# Patient Record
Sex: Male | Born: 1935 | Race: Black or African American | Hispanic: No | Marital: Married
Health system: Southern US, Community
[De-identification: ages and names within clinical notes are randomized; demographics above are authoritative.]

---

## 2017-02-05 ENCOUNTER — Other Ambulatory Visit (HOSPITAL_COMMUNITY): Payer: Self-pay

## 2017-02-05 ENCOUNTER — Inpatient Hospital Stay
Admission: AD | Admit: 2017-02-05 | Discharge: 2017-03-12 | Disposition: E | Payer: Self-pay | Source: Ambulatory Visit | Attending: Internal Medicine | Admitting: Internal Medicine

## 2017-02-05 DIAGNOSIS — Z931 Gastrostomy status: Secondary | ICD-10-CM

## 2017-02-05 DIAGNOSIS — R1310 Dysphagia, unspecified: Secondary | ICD-10-CM

## 2017-02-05 DIAGNOSIS — D499 Neoplasm of unspecified behavior of unspecified site: Secondary | ICD-10-CM

## 2017-02-05 DIAGNOSIS — Z4659 Encounter for fitting and adjustment of other gastrointestinal appliance and device: Secondary | ICD-10-CM

## 2017-02-05 LAB — HEMOGLOBIN A1C
Hgb A1c MFr Bld: 5.1 % (ref 4.8–5.6)
MEAN PLASMA GLUCOSE: 99.67 mg/dL

## 2017-02-06 ENCOUNTER — Other Ambulatory Visit (HOSPITAL_COMMUNITY): Payer: Self-pay

## 2017-02-06 LAB — PROTIME-INR
INR: 1.58
Prothrombin Time: 18.7 seconds — ABNORMAL HIGH (ref 11.4–15.2)

## 2017-02-06 LAB — CBC
HEMATOCRIT: 27.4 % — AB (ref 39.0–52.0)
HEMOGLOBIN: 9.2 g/dL — AB (ref 13.0–17.0)
MCH: 30 pg (ref 26.0–34.0)
MCHC: 33.6 g/dL (ref 30.0–36.0)
MCV: 89.3 fL (ref 78.0–100.0)
Platelets: 220 10*3/uL (ref 150–400)
RBC: 3.07 MIL/uL — AB (ref 4.22–5.81)
RDW: 20.9 % — AB (ref 11.5–15.5)
WBC: 10 10*3/uL (ref 4.0–10.5)

## 2017-02-06 LAB — MAGNESIUM: Magnesium: 2.3 mg/dL (ref 1.7–2.4)

## 2017-02-06 LAB — PHOSPHORUS: Phosphorus: 4 mg/dL (ref 2.5–4.6)

## 2017-02-06 LAB — TSH: TSH: 8.245 u[IU]/mL — ABNORMAL HIGH (ref 0.350–4.500)

## 2017-02-06 NOTE — Consult Note (Signed)
[facility] CONSULT NOTE    Date: 02/06/2017                  Patient Name:  Bobby Taylor  MRN: [id number]  DOB: 1926-11-02  Age / Sex: 83 y.o., male         PCP: System, Provider Not In                 Service Requesting Consult: Hospitalist                 Reason for Consult:  Evaluation and management of ESRD.            History of Present Illness: Patient is a 83 y.o. male with a PMHx of ESRD on HD MWF, hypertension, diabetes mellitus type 2, coronary artery disease, hyperlipidemia, peripheral arterial disease, history of CVA, anemia of chronic kidney disease, and secondary hyperparathyroidism, who was admitted to [facility] on 02/01/2017 for ongoing treatment of recent non-ST elevation myocardial infarction, MRSA sepsis, and management of ESRD.  Patient was at the outside hospital from January 23, 2017 to February 05, 2017.  When he initially presented he came in with fever and abdominal pain.  He was also hypotensive.  His initial troponin was greater than 10.  He was also found to have a left upper lobe pneumonia.  He was started on antibiotics for this.  He also had an echocardiogram which showed depressed ejection fraction.  This was felt to be secondary to his non-ST elevation myocardial infarction.  He also subsequently was found to have MRSA sepsis.  He was apparently treated with daptomycin for this.  In addition the patient developed some difficulties with swallowing.  PEG tube was being considered however in light of the recent sepsis this was deferred for now.  Medications: Medications upon discharge from outside facility: Acetaminophen 650 mg p.o. every 6 hours as needed, albuterol/ipratropium 3 mL's inhaled every 6 hours as needed, Lipitor 20 mg nightly, isosorbide mononitrate 30 mg every morning, loratadine 10 mg daily, metoprolol 50 mg twice daily, Protonix 40 mg daily, Renvela 1600 mg p.o. 3 times daily with meals, Colace 100 mg p.o. twice daily,  Epogen 10,000 units with dialysis, Neurontin 100 mg 3 times daily, thiamine 100 mg IM daily.  Allergies: pregabalin   Past Medical History:   ESRD on HD MWF, hypertension, diabetes mellitus type 2, coronary artery disease, hyperlipidemia, peripheral arterial disease, history of CVA, anemia of chronic kidney disease, and secondary hyperparathyroidism  Past Surgical History: Left upper extremity AV fistula Peripheral arterial disease with history of stent placement  Family History: Unable to obtain from the patient as he has encephalopathy at the moment.  Social History: Unable to obtain from the patient as he has encephalopathy at the moment.  Review of Systems: Unable to obtain from the patient as he has encephalopathy at the moment.  Vital Signs: Temperature 98 pulse 82 respirations 16 blood pressure 148/84  Physical Exam: General: NAD, resting comfortably  Head: Normocephalic, atraumatic.  Eyes: Anicteric  Nose: NG in place  Throat: Oropharynx nonerythematous, no exudate appreciated.   Neck: Supple, trachea midline.  Lungs:  Scattered rhonchi, normal effort  Heart: S1S2 no rubs  Abdomen:  BS normoactive. Soft, Nondistended, non-tender.  No masses or organomegaly.  Extremities: No pretibial edema.  Neurologic: Awake but not following commands  Skin: No visible rashes, scars.    Lab results: Basic Metabolic Panel: Recent Labs  Lab 02/06/17 0801  MG 2.3  PHOS  4.0    Liver Function Tests: No results for input(s): AST, ALT, ALKPHOS, BILITOT, PROT, ALBUMIN in the last 168 hours. No results for input(s): LIPASE, AMYLASE in the last 168 hours. No results for input(s): AMMONIA in the last 168 hours.  CBC: Recent Labs  Lab 02/06/17 0801  WBC 10.0  HGB 9.2*  HCT 27.4*  MCV 89.3  PLT 220    Cardiac Enzymes: No results for input(s): CKTOTAL, CKMB, CKMBINDEX, TROPONINI in the last 168 hours.  BNP: Invalid input(s): POCBNP  CBG: No results for input(s):  GLUCAP in the last 168 hours.  Microbiology: No results found for this or any previous visit.  Coagulation Studies: Recent Labs    02/06/17 0801  LABPROT 18.7*  INR 1.58    Urinalysis: No results for input(s): COLORURINE, LABSPEC, PHURINE, GLUCOSEU, HGBUR, BILIRUBINUR, KETONESUR, PROTEINUR, UROBILINOGEN, NITRITE, LEUKOCYTESUR in the last 72 hours.  Invalid input(s): APPERANCEUR    Imaging: Dg Abd 1 View  Result Date: 02/06/2017 CLINICAL DATA:  Nasogastric tube placement. EXAM: ABDOMEN - 1 VIEW COMPARISON:  Radiograph of February 05, 2017. FINDINGS: Nasogastric tube is been partially withdrawn since prior exam, with distal tip in expected position of gastroesophageal junction. Status post cholecystectomy. No abnormal bowel gas pattern is noted. IMPRESSION: Distal tip of nasogastric tube is been partially withdrawn, now located in expected position of gastroesophageal junction. Electronically Signed   By: Marijo Conception, M.D.   On: 02/06/2017 12:59   Dg Chest Port 1 View  Result Date: 02/06/2017 CLINICAL DATA:  Shortness of breath EXAM: PORTABLE CHEST 1 VIEW COMPARISON:  Portable exam 0612 hours without priors for comparison FINDINGS: RIGHT subclavian transvenous pacemaker with lead tip projecting over RIGHT ventricle. Enlargement of cardiac silhouette with pulmonary vascular congestion. Atherosclerotic calcification aorta. Scattered interstitial infiltrates question pulmonary edema though infection and chronic interstitial disease could have a similar appearance. More focal opacity likely scarring in LEFT upper lobe. No gross pleural effusion or pneumothorax. Bones demineralized. IMPRESSION: Enlargement of cardiac silhouette with pulmonary vascular congestion post pacemaker. Scattered interstitial infiltrates bilaterally question pulmonary edema as above. Electronically Signed   By: Lavonia Dana M.D.   On: 02/06/2017 09:00   Dg Abd Portable 1v  Result Date: 01/22/2017 CLINICAL DATA:   83 y/o  M; feeding tube placement. EXAM: PORTABLE ABDOMEN - 1 VIEW COMPARISON:  None. FINDINGS: Enteric tube tip projects over proximal stomach. Right upper quadrant surgical clips, presumably cholecystectomy. Single lead pacemaker noted. Normal bowel gas pattern. Multilevel degenerative changes of the spine. IMPRESSION: Enteric tube tip projects over proximal stomach. Electronically Signed   By: Kristine Garbe M.D.   On: 01/13/2017 20:24      Assessment & Plan: Pt is a 83 y.o. male with a PMHx of ESRD on HD MWF, hypertension, diabetes mellitus type 2, coronary artery disease, hyperlipidemia, peripheral arterial disease, history of CVA, anemia of chronic kidney disease, and secondary hyperparathyroidism, who was admitted to [facility] on 02/08/2017 for ongoing treatment of recent non-ST elevation myocardial infarction, MRSA sepsis, and management of ESRD.  1.  ESRD on HD.  Patient last had dialysis on Monday.  We will plan for dialysis again tomorrow given nursing schedule.  Orders to be prepared.  We plan to use his left upper extremity AV fistula.  2.  Anemia of chronic kidney disease.  Hemoglobin currently 9.2.  We will start the patient on Aranesp 60 mcg subcutaneous weekly.  3.  Secondary hyperparathyroidism.  We plan to maintain the patient on Renvela 2 tablets  p.o. 3 times daily with meals.  Follow-up PTH as well as phosphorus.  4.  MRSA sepsis.  Patient to be continued on daptomycin.

## 2017-02-07 LAB — CBC
HEMATOCRIT: 22.1 % — AB (ref 39.0–52.0)
HEMOGLOBIN: 7.6 g/dL — AB (ref 13.0–17.0)
MCH: 30.5 pg (ref 26.0–34.0)
MCHC: 34.4 g/dL (ref 30.0–36.0)
MCV: 88.8 fL (ref 78.0–100.0)
Platelets: 249 10*3/uL (ref 150–400)
RBC: 2.49 MIL/uL — ABNORMAL LOW (ref 4.22–5.81)
RDW: 21.1 % — AB (ref 11.5–15.5)
WBC: 8 10*3/uL (ref 4.0–10.5)

## 2017-02-07 LAB — RENAL FUNCTION PANEL
ALBUMIN: 1.4 g/dL — AB (ref 3.5–5.0)
Anion gap: 14 (ref 5–15)
BUN: 52 mg/dL — AB (ref 6–20)
CHLORIDE: 92 mmol/L — AB (ref 101–111)
CO2: 29 mmol/L (ref 22–32)
CREATININE: 6.02 mg/dL — AB (ref 0.61–1.24)
Calcium: 8.3 mg/dL — ABNORMAL LOW (ref 8.9–10.3)
GFR, EST AFRICAN AMERICAN: 9 mL/min — AB (ref >60)
GFR, EST NON AFRICAN AMERICAN: 7 mL/min — AB (ref >60)
Glucose, Bld: 125 mg/dL — ABNORMAL HIGH (ref 65–99)
PHOSPHORUS: 5.2 mg/dL — AB (ref 2.5–4.6)
POTASSIUM: 3.6 mmol/L (ref 3.5–5.1)
Sodium: 135 mmol/L (ref 135–145)

## 2017-02-08 LAB — PTH, INTACT AND CALCIUM
Calcium, Total (PTH): 8.5 mg/dL — ABNORMAL LOW (ref 8.6–10.2)
PTH: 86 pg/mL — ABNORMAL HIGH (ref 15–65)

## 2017-02-08 LAB — HEPATITIS B SURFACE ANTIGEN: Hepatitis B Surface Ag: NEGATIVE

## 2017-02-08 LAB — HEPATITIS B SURFACE ANTIBODY,QUALITATIVE: HEP B S AB: REACTIVE

## 2017-02-08 LAB — HEPATITIS PANEL, ACUTE
HCV Ab: 0.2 s/co ratio (ref 0.0–0.9)
HEP A IGM: NEGATIVE
HEP B C IGM: NEGATIVE
HEP B S AG: NEGATIVE

## 2017-02-08 LAB — HEPATITIS B CORE ANTIBODY, IGM: HEP B C IGM: NEGATIVE

## 2017-02-08 NOTE — Progress Notes (Signed)
[facility]  ROUNDING NOTE   Subjective:  Patient resting comfortably in bed. He will be due for dialysis again tomorrow. NG tube still in place.   Objective:  Vital signs in last 24 hours:  Temperature 97.6 pulse 80 respirations 24 blood pressure 168/80  Physical Exam: General: No acute distress  Head: Normocephalic, atraumatic. NG in place  Eyes: Anicteric  Neck: Supple, trachea midline  Lungs:  Scattered rhonchi, normal effort  Heart: S1S2 no rubs  Abdomen:  Soft, nontender, bowel sounds present  Extremities: Trace peripheral edema.  Neurologic: Awake, alert, not following commands  Skin: No lesions  Access: LUE AVF    Basic Metabolic Panel: Recent Labs  Lab 02/06/17 0801 02/07/17 0519  NA  --  135  K  --  3.6  CL  --  92*  CO2  --  29  GLUCOSE  --  125*  BUN  --  52*  CREATININE  --  6.02*  CALCIUM  --  8.3*  MG 2.3  --   PHOS 4.0 5.2*    Liver Function Tests: Recent Labs  Lab 02/07/17 0519  ALBUMIN 1.4*   No results for input(s): LIPASE, AMYLASE in the last 168 hours. No results for input(s): AMMONIA in the last 168 hours.  CBC: Recent Labs  Lab 02/06/17 0801 02/07/17 0519  WBC 10.0 8.0  HGB 9.2* 7.6*  HCT 27.4* 22.1*  MCV 89.3 88.8  PLT 220 249    Cardiac Enzymes: No results for input(s): CKTOTAL, CKMB, CKMBINDEX, TROPONINI in the last 168 hours.  BNP: Invalid input(s): POCBNP  CBG: No results for input(s): GLUCAP in the last 168 hours.  Microbiology: No results found for this or any previous visit.  Coagulation Studies: Recent Labs    02/06/17 0801  LABPROT 18.7*  INR 1.58    Urinalysis: No results for input(s): COLORURINE, LABSPEC, PHURINE, GLUCOSEU, HGBUR, BILIRUBINUR, KETONESUR, PROTEINUR, UROBILINOGEN, NITRITE, LEUKOCYTESUR in the last 72 hours.  Invalid input(s): APPERANCEUR    Imaging: Dg Abd 1 View  Result Date: 02/06/2017 CLINICAL DATA:  Nasogastric tube placement. EXAM: ABDOMEN - 1 VIEW  COMPARISON:  Radiograph of February 05, 2017. FINDINGS: Nasogastric tube is been partially withdrawn since prior exam, with distal tip in expected position of gastroesophageal junction. Status post cholecystectomy. No abnormal bowel gas pattern is noted. IMPRESSION: Distal tip of nasogastric tube is been partially withdrawn, now located in expected position of gastroesophageal junction. Electronically Signed   By: Marijo Conception, M.D.   On: 02/06/2017 12:59     Medications:       Assessment/ Plan:  83 y.o. male with a PMHx of ESRD on HD MWF, hypertension, diabetes mellitus type 2, coronary artery disease, hyperlipidemia, peripheral arterial disease, history of CVA, anemia of chronic kidney disease, and secondary hyperparathyroidism, who was admitted to [facility] on 02/01/2017 for ongoing treatment of recent non-ST elevation myocardial infarction, MRSA sepsis, and management of ESRD.  1.  ESRD on HD.    Plan to maintain the patient on a Tuesday, Thursday, Saturday dialysis schedule.  Therefore he will be scheduled for dialysis again tomorrow.  We will prepare orders.  2.  Anemia of chronic kidney disease.    Hemoglobin down significantly to 7.6.  Consider blood transfusion for hemoglobin of 7 or less.  Continue Aranesp otherwise.  3.  Secondary hyperparathyroidism.    Phosphorus currently 5.2.  Continue to monitor.  4.  MRSA sepsis.  Patient to be treated with daptomycin as per hospitalist.  LOS: 0 Aniayah Alaniz 11/30/20188:31 AM

## 2017-02-09 LAB — CBC
HEMATOCRIT: 22.2 % — AB (ref 39.0–52.0)
Hemoglobin: 7.3 g/dL — ABNORMAL LOW (ref 13.0–17.0)
MCH: 29.8 pg (ref 26.0–34.0)
MCHC: 32.9 g/dL (ref 30.0–36.0)
MCV: 90.6 fL (ref 78.0–100.0)
Platelets: 273 10*3/uL (ref 150–400)
RBC: 2.45 MIL/uL — ABNORMAL LOW (ref 4.22–5.81)
RDW: 20.8 % — AB (ref 11.5–15.5)
WBC: 7.9 10*3/uL (ref 4.0–10.5)

## 2017-02-09 LAB — RENAL FUNCTION PANEL
ALBUMIN: 1.4 g/dL — AB (ref 3.5–5.0)
Anion gap: 12 (ref 5–15)
BUN: 46 mg/dL — AB (ref 6–20)
CHLORIDE: 93 mmol/L — AB (ref 101–111)
CO2: 27 mmol/L (ref 22–32)
Calcium: 8.2 mg/dL — ABNORMAL LOW (ref 8.9–10.3)
Creatinine, Ser: 5.94 mg/dL — ABNORMAL HIGH (ref 0.61–1.24)
GFR calc Af Amer: 9 mL/min — ABNORMAL LOW (ref >60)
GFR calc non Af Amer: 8 mL/min — ABNORMAL LOW (ref >60)
GLUCOSE: 216 mg/dL — AB (ref 65–99)
Phosphorus: 3.5 mg/dL (ref 2.5–4.6)
Potassium: 3.2 mmol/L — ABNORMAL LOW (ref 3.5–5.1)
Sodium: 132 mmol/L — ABNORMAL LOW (ref 135–145)

## 2017-02-09 DEATH — deceased

## 2017-02-11 NOTE — Progress Notes (Signed)
  [facility]  ROUNDING NOTE   Subjective:  Patient seen at bedside. Has a temperature 101.5 at the moment.    Objective:  Vital signs in last 24 hours:  Temperature 101.5 pulse 88 respirations 20 blood pressure 126/58  Physical Exam: General: No acute distress  Head: Normocephalic, atraumatic. NG in place  Eyes: Anicteric  Neck: Supple, trachea midline  Lungs:  Scattered rhonchi, normal effort  Heart: S1S2 no rubs  Abdomen:  Soft, nontender, bowel sounds present  Extremities: Trace peripheral edema.  Neurologic: Awake, alert, not following commands  Skin: No lesions  Access: LUE AVF    Basic Metabolic Panel: Recent Labs  Lab 02/06/17 0801 02/07/17 0519 02/09/17 0521  NA  --  135 132*  K  --  3.6 3.2*  CL  --  92* 93*  CO2  --  29 27  GLUCOSE  --  125* 216*  BUN  --  52* 46*  CREATININE  --  6.02* 5.94*  CALCIUM  --  8.3*  8.5* 8.2*  MG 2.3  --   --   PHOS 4.0 5.2* 3.5    Liver Function Tests: Recent Labs  Lab 02/07/17 0519 02/09/17 0521  ALBUMIN 1.4* 1.4*   No results for input(s): LIPASE, AMYLASE in the last 168 hours. No results for input(s): AMMONIA in the last 168 hours.  CBC: Recent Labs  Lab 02/06/17 0801 02/07/17 0519 02/09/17 0521  WBC 10.0 8.0 7.9  HGB 9.2* 7.6* 7.3*  HCT 27.4* 22.1* 22.2*  MCV 89.3 88.8 90.6  PLT 220 249 273    Cardiac Enzymes: No results for input(s): CKTOTAL, CKMB, CKMBINDEX, TROPONINI in the last 168 hours.  BNP: Invalid input(s): POCBNP  CBG: No results for input(s): GLUCAP in the last 168 hours.  Microbiology: No results found for this or any previous visit.  Coagulation Studies: No results for input(s): LABPROT, INR in the last 72 hours.  Urinalysis: No results for input(s): COLORURINE, LABSPEC, PHURINE, GLUCOSEU, HGBUR, BILIRUBINUR, KETONESUR, PROTEINUR, UROBILINOGEN, NITRITE, LEUKOCYTESUR in the last 72 hours.  Invalid input(s): APPERANCEUR    Imaging: No results  found.   Medications:       Assessment/ Plan:  83 y.o. male with a PMHx of ESRD on HD MWF, hypertension, diabetes mellitus type 2, coronary artery disease, hyperlipidemia, peripheral arterial disease, history of CVA, anemia of chronic kidney disease, and secondary hyperparathyroidism, who was admitted to [facility] on 01/24/2017 for ongoing treatment of recent non-ST elevation myocardial infarction, MRSA sepsis, and management of ESRD.  1.  ESRD on HD.    Patient to have hemodialysis again tomorrow.  Orders will be prepared.  2.  Anemia of chronic kidney disease.    Hemoglobin down to 7.3.  Otherwise we will maintain the patient on Aranesp.  Consider blood transfusion for hemoglobin of 7 or less.  3.  Secondary hyperparathyroidism.    Phosphorus at target at 3.5 at last check.  Continue to monitor.  4.  MRSA sepsis.  Patient to be treated with daptomycin as per hospitalist.     LOS: 0 Shiven Junious 12/3/20184:29 PM

## 2017-02-12 LAB — RENAL FUNCTION PANEL
ALBUMIN: 1.4 g/dL — AB (ref 3.5–5.0)
ANION GAP: 13 (ref 5–15)
BUN: 55 mg/dL — ABNORMAL HIGH (ref 6–20)
CALCIUM: 8.4 mg/dL — AB (ref 8.9–10.3)
CO2: 29 mmol/L (ref 22–32)
Chloride: 88 mmol/L — ABNORMAL LOW (ref 101–111)
Creatinine, Ser: 5.68 mg/dL — ABNORMAL HIGH (ref 0.61–1.24)
GFR, EST AFRICAN AMERICAN: 9 mL/min — AB (ref >60)
GFR, EST NON AFRICAN AMERICAN: 8 mL/min — AB (ref >60)
Glucose, Bld: 187 mg/dL — ABNORMAL HIGH (ref 65–99)
PHOSPHORUS: 4 mg/dL (ref 2.5–4.6)
Potassium: 4.6 mmol/L (ref 3.5–5.1)
SODIUM: 130 mmol/L — AB (ref 135–145)

## 2017-02-12 LAB — CBC
HCT: 23.9 % — ABNORMAL LOW (ref 39.0–52.0)
HEMOGLOBIN: 7.7 g/dL — AB (ref 13.0–17.0)
MCH: 29.7 pg (ref 26.0–34.0)
MCHC: 32.2 g/dL (ref 30.0–36.0)
MCV: 92.3 fL (ref 78.0–100.0)
Platelets: 280 10*3/uL (ref 150–400)
RBC: 2.59 MIL/uL — ABNORMAL LOW (ref 4.22–5.81)
RDW: 21.3 % — ABNORMAL HIGH (ref 11.5–15.5)
WBC: 6.3 10*3/uL (ref 4.0–10.5)

## 2017-02-12 LAB — CK: CK TOTAL: 37 U/L — AB (ref 49–397)

## 2017-02-13 NOTE — Progress Notes (Signed)
  [facility]  ROUNDING NOTE   Subjective:  Patient resting in bed. Intermittently moaning. Not following commands. Completed hemodialysis yesterday.  Objective:  Vital signs in last 24 hours:  Temperature 97.8 pulse 73 respirations 20 blood pressure 169/92  Physical Exam: General: No acute distress  Head: Normocephalic, atraumatic. NG in place  Eyes: Anicteric  Neck: Supple, trachea midline  Lungs:  Scattered rhonchi, normal effort  Heart: S1S2 no rubs  Abdomen:  Soft, nontender, bowel sounds present  Extremities: Trace peripheral edema.  Neurologic: Awake, alert, not following commands  Skin: No lesions  Access: LUE AVF    Basic Metabolic Panel: Recent Labs  Lab 02/07/17 0519 02/09/17 0521 02/12/17 0634  NA 135 132* 130*  K 3.6 3.2* 4.6  CL 92* 93* 88*  CO2 29 27 29   GLUCOSE 125* 216* 187*  BUN 52* 46* 55*  CREATININE 6.02* 5.94* 5.68*  CALCIUM 8.3*  8.5* 8.2* 8.4*  PHOS 5.2* 3.5 4.0    Liver Function Tests: Recent Labs  Lab 02/07/17 0519 02/09/17 0521 02/12/17 0634  ALBUMIN 1.4* 1.4* 1.4*   No results for input(s): LIPASE, AMYLASE in the last 168 hours. No results for input(s): AMMONIA in the last 168 hours.  CBC: Recent Labs  Lab 02/07/17 0519 02/09/17 0521 02/12/17 0634  WBC 8.0 7.9 6.3  HGB 7.6* 7.3* 7.7*  HCT 22.1* 22.2* 23.9*  MCV 88.8 90.6 92.3  PLT 249 273 280    Cardiac Enzymes: Recent Labs  Lab 02/12/17 0634  CKTOTAL 37*    BNP: Invalid input(s): POCBNP  CBG: No results for input(s): GLUCAP in the last 168 hours.  Microbiology: No results found for this or any previous visit.  Coagulation Studies: No results for input(s): LABPROT, INR in the last 72 hours.  Urinalysis: No results for input(s): COLORURINE, LABSPEC, PHURINE, GLUCOSEU, HGBUR, BILIRUBINUR, KETONESUR, PROTEINUR, UROBILINOGEN, NITRITE, LEUKOCYTESUR in the last 72 hours.  Invalid input(s): APPERANCEUR    Imaging: No results  found.   Medications:       Assessment/ Plan:  83 y.o. male with a PMHx of ESRD on HD MWF, hypertension, diabetes mellitus type 2, coronary artery disease, hyperlipidemia, peripheral arterial disease, history of CVA, anemia of chronic kidney disease, and secondary hyperparathyroidism, who was admitted to [facility] on 01/17/2017 for ongoing treatment of recent non-ST elevation myocardial infarction, MRSA sepsis, and management of ESRD.  1.  ESRD on HD.    Patient completed hemodialysis yesterday.  We will plan for dialysis again tomorrow.  Orders to be prepared.  2.  Anemia of chronic kidney disease.    Hemoglobin up slightly to 7.7.  Maintain the patient on Aranesp.  Consider blood transition for hemoglobin of 7 or less.\  3.  Secondary hyperparathyroidism.    Phosphorus remains under good control at 4.0.  Follow-up serum phosphorus tomorrow.  4.  MRSA sepsis.  Patient to be treated with daptomycin as per hospitalist.  5.  Severe protein calorie malnutrition.  Albumin currently 1.4.  Nutrition team on board.    LOS: 0 Bobby Taylor 12/5/20183:06 PM

## 2017-02-14 LAB — RENAL FUNCTION PANEL
ALBUMIN: 1.4 g/dL — AB (ref 3.5–5.0)
ANION GAP: 13 (ref 5–15)
BUN: 70 mg/dL — ABNORMAL HIGH (ref 6–20)
CALCIUM: 8.3 mg/dL — AB (ref 8.9–10.3)
CO2: 27 mmol/L (ref 22–32)
CREATININE: 5.27 mg/dL — AB (ref 0.61–1.24)
Chloride: 89 mmol/L — ABNORMAL LOW (ref 101–111)
GFR calc non Af Amer: 9 mL/min — ABNORMAL LOW (ref >60)
GFR, EST AFRICAN AMERICAN: 10 mL/min — AB (ref >60)
Glucose, Bld: 117 mg/dL — ABNORMAL HIGH (ref 65–99)
PHOSPHORUS: 3.9 mg/dL (ref 2.5–4.6)
Potassium: 4.3 mmol/L (ref 3.5–5.1)
SODIUM: 129 mmol/L — AB (ref 135–145)

## 2017-02-14 LAB — CBC
HCT: 21.4 % — ABNORMAL LOW (ref 39.0–52.0)
HEMOGLOBIN: 7.2 g/dL — AB (ref 13.0–17.0)
MCH: 31 pg (ref 26.0–34.0)
MCHC: 33.6 g/dL (ref 30.0–36.0)
MCV: 92.2 fL (ref 78.0–100.0)
Platelets: 280 10*3/uL (ref 150–400)
RBC: 2.32 MIL/uL — AB (ref 4.22–5.81)
RDW: 21 % — ABNORMAL HIGH (ref 11.5–15.5)
WBC: 8.2 10*3/uL (ref 4.0–10.5)

## 2017-02-15 LAB — HEPARIN LEVEL (UNFRACTIONATED): Heparin Unfractionated: >2.2 IU/mL — ABNORMAL HIGH (ref 0.30–0.70)

## 2017-02-15 NOTE — Progress Notes (Signed)
  [facility]  ROUNDING NOTE   Subjective:  No significant change in status. Patient resting comfortably in bed. Still has NG tube in place.  Objective:  Vital signs in last 24 hours:  Temperature 99.2 pulse 67 respirations 20 blood pressure 115/54  Physical Exam: General: No acute distress  Head: Normocephalic, atraumatic. NG in place  Eyes: Anicteric  Neck: Supple, trachea midline  Lungs:  Scattered rhonchi, normal effort  Heart: S1S2 no rubs  Abdomen:  Soft, nontender, bowel sounds present  Extremities: Trace peripheral edema.  Neurologic: Awake, alert, not following commands  Skin: No lesions  Access: LUE AVF    Basic Metabolic Panel: Recent Labs  Lab 02/09/17 0521 02/12/17 0634 02/14/17 0652  NA 132* 130* 129*  K 3.2* 4.6 4.3  CL 93* 88* 89*  CO2 27 29 27   GLUCOSE 216* 187* 117*  BUN 46* 55* 70*  CREATININE 5.94* 5.68* 5.27*  CALCIUM 8.2* 8.4* 8.3*  PHOS 3.5 4.0 3.9    Liver Function Tests: Recent Labs  Lab 02/09/17 0521 02/12/17 0634 02/14/17 0652  ALBUMIN 1.4* 1.4* 1.4*   No results for input(s): LIPASE, AMYLASE in the last 168 hours. No results for input(s): AMMONIA in the last 168 hours.  CBC: Recent Labs  Lab 02/09/17 0521 02/12/17 0634 02/14/17 0652  WBC 7.9 6.3 8.2  HGB 7.3* 7.7* 7.2*  HCT 22.2* 23.9* 21.4*  MCV 90.6 92.3 92.2  PLT 273 280 280    Cardiac Enzymes: Recent Labs  Lab 02/12/17 0634  CKTOTAL 37*    BNP: Invalid input(s): POCBNP  CBG: No results for input(s): GLUCAP in the last 168 hours.  Microbiology: No results found for this or any previous visit.  Coagulation Studies: No results for input(s): LABPROT, INR in the last 72 hours.  Urinalysis: No results for input(s): COLORURINE, LABSPEC, PHURINE, GLUCOSEU, HGBUR, BILIRUBINUR, KETONESUR, PROTEINUR, UROBILINOGEN, NITRITE, LEUKOCYTESUR in the last 72 hours.  Invalid input(s): APPERANCEUR    Imaging: No results found.   Medications:        Assessment/ Plan:  83 y.o. male with a PMHx of ESRD on HD MWF, hypertension, diabetes mellitus type 2, coronary artery disease, hyperlipidemia, peripheral arterial disease, history of CVA, anemia of chronic kidney disease, and secondary hyperparathyroidism, who was admitted to [facility] on 01/30/2017 for ongoing treatment of recent non-ST elevation myocardial infarction, MRSA sepsis, and management of ESRD.  1.  ESRD on HD.    Patient completed hemodialysis yesterday.  We will plan for dialysis again tomorrow.  Ultrafiltration target 1.5 kg.  2.  Anemia of chronic kidney disease.    Hemoglobin continues to drift down and is currently 7.2.  If hemoglobin becomes 7 or less would recommend transfusion.  3.  Secondary hyperparathyroidism.    Phosphorus 3.9 and at target.  Continue to monitor.  4.  MRSA sepsis.  Management as per hospitalist.  5.  Severe protein calorie malnutrition.  Protein calorie malnutrition persists.  Albumin remains low at 1.4.   LOS: 0 Bobby Taylor 12/7/20188:49 AM

## 2017-02-16 LAB — CBC
HCT: 21.6 % — ABNORMAL LOW (ref 39.0–52.0)
Hemoglobin: 7 g/dL — ABNORMAL LOW (ref 13.0–17.0)
MCH: 30.4 pg (ref 26.0–34.0)
MCHC: 32.4 g/dL (ref 30.0–36.0)
MCV: 93.9 fL (ref 78.0–100.0)
PLATELETS: 272 10*3/uL (ref 150–400)
RBC: 2.3 MIL/uL — ABNORMAL LOW (ref 4.22–5.81)
RDW: 21.1 % — AB (ref 11.5–15.5)
WBC: 8.2 10*3/uL (ref 4.0–10.5)

## 2017-02-16 LAB — RENAL FUNCTION PANEL
ALBUMIN: 1.3 g/dL — AB (ref 3.5–5.0)
Anion gap: 10 (ref 5–15)
BUN: 55 mg/dL — AB (ref 6–20)
CALCIUM: 8.2 mg/dL — AB (ref 8.9–10.3)
CO2: 29 mmol/L (ref 22–32)
Chloride: 93 mmol/L — ABNORMAL LOW (ref 101–111)
Creatinine, Ser: 4.07 mg/dL — ABNORMAL HIGH (ref 0.61–1.24)
GFR calc Af Amer: 14 mL/min — ABNORMAL LOW (ref >60)
GFR calc non Af Amer: 12 mL/min — ABNORMAL LOW (ref >60)
GLUCOSE: 160 mg/dL — AB (ref 65–99)
PHOSPHORUS: 3.7 mg/dL (ref 2.5–4.6)
Potassium: 4.2 mmol/L (ref 3.5–5.1)
SODIUM: 132 mmol/L — AB (ref 135–145)

## 2017-02-18 ENCOUNTER — Other Ambulatory Visit (HOSPITAL_COMMUNITY): Payer: Self-pay

## 2017-02-18 ENCOUNTER — Encounter: Payer: Self-pay | Admitting: General Surgery

## 2017-02-18 NOTE — Progress Notes (Signed)
[facility]  ROUNDING NOTE   Subjective:  Patient seen at bedside. It appears that he will be receiving PEG tube placement in the relative near future. Due for dialysis tomorrow.  Objective:  Vital signs in last 24 hours:  Temperature 96.5 pulse 68 respirations 16 blood pressure 154/83  Physical Exam: General: No acute distress  Head: Normocephalic, atraumatic. NG in place  Eyes: Anicteric  Neck: Supple, trachea midline  Lungs:  Scattered rhonchi, normal effort  Heart: S1S2 no rubs  Abdomen:  Soft, nontender, bowel sounds present  Extremities: Trace peripheral edema.  Neurologic: Awake, alert, not following commands  Skin: No lesions  Access: LUE AVF    Basic Metabolic Panel: Recent Labs  Lab 02/12/17 0634 02/14/17 0652 02/16/17 0437  NA 130* 129* 132*  K 4.6 4.3 4.2  CL 88* 89* 93*  CO2 29 27 29   GLUCOSE 187* 117* 160*  BUN 55* 70* 55*  CREATININE 5.68* 5.27* 4.07*  CALCIUM 8.4* 8.3* 8.2*  PHOS 4.0 3.9 3.7    Liver Function Tests: Recent Labs  Lab 02/12/17 0634 02/14/17 0652 02/16/17 0437  ALBUMIN 1.4* 1.4* 1.3*   No results for input(s): LIPASE, AMYLASE in the last 168 hours. No results for input(s): AMMONIA in the last 168 hours.  CBC: Recent Labs  Lab 02/12/17 0634 02/14/17 0652 02/16/17 0437  WBC 6.3 8.2 8.2  HGB 7.7* 7.2* 7.0*  HCT 23.9* 21.4* 21.6*  MCV 92.3 92.2 93.9  PLT 280 280 272    Cardiac Enzymes: Recent Labs  Lab 02/12/17 0634  CKTOTAL 37*    BNP: Invalid input(s): POCBNP  CBG: No results for input(s): GLUCAP in the last 168 hours.  Microbiology: No results found for this or any previous visit.  Coagulation Studies: No results for input(s): LABPROT, INR in the last 72 hours.  Urinalysis: No results for input(s): COLORURINE, LABSPEC, PHURINE, GLUCOSEU, HGBUR, BILIRUBINUR, KETONESUR, PROTEINUR, UROBILINOGEN, NITRITE, LEUKOCYTESUR in the last 72 hours.  Invalid input(s): APPERANCEUR    Imaging: Ct  Abdomen Wo Contrast  Result Date: 02/18/2017 CLINICAL DATA:  Myocardial infarction, recent sepsis and difficulty swallowing. Evaluation for possible percutaneous gastrostomy tube placement. History of end-stage renal disease. EXAM: CT ABDOMEN WITHOUT CONTRAST TECHNIQUE: Multidetector CT imaging of the abdomen was performed following the standard protocol without IV contrast. COMPARISON:  Abdominal film on 02/18/2017 FINDINGS: Lower chest: Bibasilar atelectasis with trace bilateral pleural effusions. Blebs/pneumatoceles at both lung bases. Hepatobiliary: No focal liver abnormality is seen. Status post cholecystectomy. No biliary dilatation. Pancreas: Unremarkable. No pancreatic ductal dilatation or surrounding inflammatory changes. Spleen: Normal in size without focal abnormality. Adrenals/Urinary Tract: Hyperplastic appearance of left adrenal gland. Atrophic bilateral kidneys without hydronephrosis. Stomach/Bowel: The stomach is transverse in orientation in a very high position under the left hemidiaphragm. A nasogastric tube extends just barely into the proximal stomach. The splenic flexure of the colon lies immediately inferolateral to the body of the stomach. Mild gas is distension of the visualize colon without evidence of overt bowel obstruction. No free air identified. No evidence of hiatal hernia. Vascular/Lymphatic: Calcified plaque in the abdominal aorta without evidence aneurysm. Extensive calcified plaque also extends into the proximal superior mesenteric artery bilateral proximal renal arteries. No enlarged lymph nodes identified in the abdomen. Other: No abdominal wall hernia or abnormality. Musculoskeletal: Degenerative disc disease at multiple levels and probable renal osteodystrophy. IMPRESSION: Gastric anatomy is not prohibitive to potential attempted percutaneous gastrostomy tube placement. However, the stomach is very high in location and transversely oriented. In  addition, the colon is mildly  distended with the splenic flexure lying immediately lateral to the body of the stomach. Barium administration through the nasogastric tube will be performed prior to gastrostomy tube placement in order to try to opacified the colon. Electronically Signed   By: Aletta Edouard M.D.   On: 02/18/2017 12:32   Dg Abd Portable 1v  Result Date: 02/18/2017 CLINICAL DATA:  Advanced tube EXAM: PORTABLE ABDOMEN - 1 VIEW COMPARISON:  None. FINDINGS: Enteric tube terminates in the gastric cardia. Nonobstructive bowel gas pattern. Cholecystectomy clips. Degenerative changes of the lumbar spine. Vascular calcifications. IMPRESSION: Enteric tube terminates in the gastric cardia. Electronically Signed   By: Julian Hy M.D.   On: 02/18/2017 13:13   Dg Abd Portable 1v  Result Date: 02/18/2017 CLINICAL DATA:  Nasogastric tube placement. EXAM: PORTABLE ABDOMEN - 1 VIEW COMPARISON:  KUB of February 06, 2017 FINDINGS: The nasogastric tube tip projects at the GE junction with the proximal port above the GE junction. Advancement by at least 10-15 cm is needed. There is a loop moderately distended small bowel in the mid abdomen. No free extraluminal gas collections are observed. There surgical clips in the gallbladder fossa. There is calcification in the wall of the abdominal aorta and common iliac vessels. IMPRESSION: Advancement of the nasogastric tube by 10-15 cm is needed. The current positioning is inadequate. Electronically Signed   By: David  Martinique M.D.   On: 02/18/2017 10:26     Medications:       Assessment/ Plan:  83 y.o. male with a PMHx of ESRD on HD MWF, hypertension, diabetes mellitus type 2, coronary artery disease, hyperlipidemia, peripheral arterial disease, history of CVA, anemia of chronic kidney disease, and secondary hyperparathyroidism, who was admitted to [facility] on 01/21/2017 for ongoing treatment of recent non-ST elevation myocardial infarction, MRSA sepsis, and management of ESRD.  1.   ESRD on HD.    Patient due for hemodialysis again tomorrow.  Orders to be prepared.  Ultrafiltration target 1.5 kg.  2.  Anemia of chronic kidney disease.    Hemoglobin currently 7.0.  If hemoglobin falls any lower would recommend blood transfusion.  3.  Secondary hyperparathyroidism.    Phosphorus was 3.7 at last check and at target.  4.  MRSA sepsis.  Management as per hospitalist.  5.  Severe protein calorie malnutrition.  Albumin remains very low at 1.3.  He will be receiving PEG tube placement in the relative near future.  LOS: 0 Bo Rogue 12/10/20184:24 PM

## 2017-02-18 NOTE — Consult Note (Signed)
Chief Complaint: Dysphagia, PCM  Referring Physician:Dr. Merton Border  Supervising Physician: Aletta Edouard  Patient Status: [facility]  HPI: Bobby Taylor is a 83 y.o. male with a past medical history including a cerebrovascular accident, end-stage renal disease on hemodialysis, diabetes, recent non-ST segment elevated myocardial infarction, MRSA bacteremia who was treated in outside hospital for the aforementioned.  [facility].  The patient does have dementia and confusion.  He does not tolerate oral intake and a PANDA tube has been placed for feeding access.  The patient is currently in restraints as well secondary to his confusion.  A request has been made for gastrostomy tube placement.  Past Medical History: Past medical history was reviewed in his paper chart.  Past Surgical History: Noncontributory  Family History: Noncontributory  Social History: Reviewed in paper chart  Allergies: Reviewed in paper chart  Medications: Reviewed in paper chart.  The patient is on Eliquis.  This has not been given today and will be held for 48 hours prior to his procedure.  Patient unable to provide a review of systems secondary to confusion and dementia.  Mallampati Score: MD Evaluation Airway: WNL Heart: WNL Abdomen: WNL Chest/ Lungs: WNL ASA  Classification: 3 Mallampati/Airway Score: (unable as he will not open his mouth)  Physical Exam: Vital signs reviewed in paper chart General: Noncommunicative elderly black male who is cachectic but in no acute distress. HEENT: head is normocephalic, atraumatic.  Sclera are noninjected.  PERRL.  Ears and nose without any masses or lesions.  Nasogastric tube in place. mouth is pink and moist Heart: regular, rate, and rhythm with a few ectopic beats.  Normal s1,s2. No obvious murmurs, gallops, or rubs noted.  Palpable radial pulses bilaterally Lungs: CTAB, no wheezes, rhonchi, or  rales noted.  Respiratory effort nonlabored Abd: soft, NT, ND, +BS, no masses, hernias, or organomegaly Psych: Awake, noncommunicative, does not follow commands.   Labs: Sodium 135 - 145 mmol/L 132 Abnormally low   129 Abnormally low    Potassium 3.5 - 5.1 mmol/L 4.2  4.3   Chloride 101 - 111 mmol/L 93 Abnormally low   89 Abnormally low    CO2 22 - 32 mmol/L 29  27   Glucose, Bld 65 - 99 mg/dL 160 Abnormally high   117 Abnormally high    BUN 6 - 20 mg/dL 55 Abnormally high   70 Abnormally high    Creatinine, Ser 0.61 - 1.24 mg/dL 4.07 Abnormally high   5.27 Abnormally high    Calcium 8.9 - 10.3 mg/dL 8.2 Abnormally low   8.3 Abnormally low    Phosphorus 2.5 - 4.6 mg/dL 3.7  3.9   Albumin 3.5 - 5.0 g/dL 1.3 Abnormally low   1.4 Abnormally low    GFR calc non Af Amer >60 mL/min 12 Abnormally low   9 Abnormally low    GFR calc Af Amer >60 mL/min 14 Abnormally low   10 Abnormally low  CM   WBC 4.0 - 10.5 K/uL 8.2  8.2   RBC 4.22 - 5.81 MIL/uL 2.30 Abnormally low   2.32 Abnormally low    Hemoglobin 13.0 - 17.0 g/dL 7.0 Abnormally low   7.2 Abnormally low    HCT 39.0 - 52.0 % 21.6 Abnormally low   21.4 Abnormally low    MCV 78.0 - 100.0 fL 93.9  92.2   MCH 26.0 - 34.0 pg 30.4  31.0   MCHC 30.0 - 36.0 g/dL 32.4  33.6   RDW 11.5 - 15.5 % 21.1 Abnormally high   21.0 Abnormally high    Platelets 150 - 400 K/uL 272  280      Imaging: Ct Abdomen Wo Contrast  Result Date: 02/18/2017 CLINICAL DATA:  Myocardial infarction, recent sepsis and difficulty swallowing. Evaluation for possible percutaneous gastrostomy tube placement. History of end-stage renal disease. EXAM: CT ABDOMEN WITHOUT CONTRAST TECHNIQUE: Multidetector CT imaging of the abdomen was performed following the standard protocol without IV contrast. COMPARISON:  Abdominal film on 02/18/2017 FINDINGS: Lower chest: Bibasilar atelectasis with trace bilateral pleural effusions. Blebs/pneumatoceles at both lung bases. Hepatobiliary: No  focal liver abnormality is seen. Status post cholecystectomy. No biliary dilatation. Pancreas: Unremarkable. No pancreatic ductal dilatation or surrounding inflammatory changes. Spleen: Normal in size without focal abnormality. Adrenals/Urinary Tract: Hyperplastic appearance of left adrenal gland. Atrophic bilateral kidneys without hydronephrosis. Stomach/Bowel: The stomach is transverse in orientation in a very high position under the left hemidiaphragm. A nasogastric tube extends just barely into the proximal stomach. The splenic flexure of the colon lies immediately inferolateral to the body of the stomach. Mild gas is distension of the visualize colon without evidence of overt bowel obstruction. No free air identified. No evidence of hiatal hernia. Vascular/Lymphatic: Calcified plaque in the abdominal aorta without evidence aneurysm. Extensive calcified plaque also extends into the proximal superior mesenteric artery bilateral proximal renal arteries. No enlarged lymph nodes identified in the abdomen. Other: No abdominal wall hernia or abnormality. Musculoskeletal: Degenerative disc disease at multiple levels and probable renal osteodystrophy. IMPRESSION: Gastric anatomy is not prohibitive to potential attempted percutaneous gastrostomy tube placement. However, the stomach is very high in location and transversely oriented. In addition, the colon is mildly distended with the splenic flexure lying immediately lateral to the body of the stomach. Barium administration through the nasogastric tube will be performed prior to gastrostomy tube placement in order to try to opacified the colon. Electronically Signed   By: Aletta Edouard M.D.   On: 02/18/2017 12:32   Dg Abd Portable 1v  Result Date: 02/18/2017 CLINICAL DATA:  Advanced tube EXAM: PORTABLE ABDOMEN - 1 VIEW COMPARISON:  None. FINDINGS: Enteric tube terminates in the gastric cardia. Nonobstructive bowel gas pattern. Cholecystectomy clips. Degenerative  changes of the lumbar spine. Vascular calcifications. IMPRESSION: Enteric tube terminates in the gastric cardia. Electronically Signed   By: Julian Hy M.D.   On: 02/18/2017 13:13   Dg Abd Portable 1v  Result Date: 02/18/2017 CLINICAL DATA:  Nasogastric tube placement. EXAM: PORTABLE ABDOMEN - 1 VIEW COMPARISON:  KUB of February 06, 2017 FINDINGS: The nasogastric tube tip projects at the GE junction with the proximal port above the GE junction. Advancement by at least 10-15 cm is needed. There is a loop moderately distended small bowel in the mid abdomen. No free extraluminal gas collections are observed. There surgical clips in the gallbladder fossa. There is calcification in the wall of the abdominal aorta and common iliac vessels. IMPRESSION: Advancement of the nasogastric tube by 10-15 cm is needed. The current positioning is inadequate. Electronically Signed   By: David  Martinique M.D.   On: 02/18/2017 10:26    Assessment/Plan 1.  Dysphasia 2.  Protein calorie malnutrition  I have spoken to the patient's wife, Hancy.  She is agreeable for gastrostomy tube placement.  The patient's Eliquis has not been given today.  Will be held tomorrow as well with anticipation for gastrostomy tube placement on Wednesday, December 12.  He will be n.p.o. after midnight  in preparation for this procedure on Wednesday.  He will be given Ancef prior to G-tube placement as well.  Because of his anatomy, thin barium will be given down his nasogastric tube on December 11 at 2000 hrs. in preparation for his procedure the following day as well. Risks and benefits discussed with the patient's wife including, but not limited to the need for a barium enema during the procedure, bleeding, infection, peritonitis, or damage to adjacent structures. All of the patient's wife's questions were answered, patient's wife is agreeable to proceed. Consent signed and in the radiology department  Thank you for this interesting  consult.  I greatly enjoyed meeting Bobby Taylor and look forward to participating in their care.  A copy of this report was sent to the requesting provider on this date.  Electronically Signed: Henreitta Cea 02/18/2017, 3:37 PM   I spent a total of 40 Minutes    in face to face in clinical consultation, greater than 50% of which was counseling/coordinating care for dysphagia, PCM

## 2017-02-19 LAB — CBC
HCT: 19 % — ABNORMAL LOW (ref 39.0–52.0)
Hemoglobin: 6.3 g/dL — CL (ref 13.0–17.0)
MCH: 30.7 pg (ref 26.0–34.0)
MCHC: 33.2 g/dL (ref 30.0–36.0)
MCV: 92.7 fL (ref 78.0–100.0)
PLATELETS: 269 10*3/uL (ref 150–400)
RBC: 2.05 MIL/uL — ABNORMAL LOW (ref 4.22–5.81)
RDW: 20 % — AB (ref 11.5–15.5)
WBC: 9.3 10*3/uL (ref 4.0–10.5)

## 2017-02-19 LAB — RENAL FUNCTION PANEL
ALBUMIN: 1.3 g/dL — AB (ref 3.5–5.0)
Anion gap: 12 (ref 5–15)
BUN: 81 mg/dL — AB (ref 6–20)
CHLORIDE: 94 mmol/L — AB (ref 101–111)
CO2: 25 mmol/L (ref 22–32)
CREATININE: 5.13 mg/dL — AB (ref 0.61–1.24)
Calcium: 8.1 mg/dL — ABNORMAL LOW (ref 8.9–10.3)
GFR calc Af Amer: 10 mL/min — ABNORMAL LOW (ref >60)
GFR, EST NON AFRICAN AMERICAN: 9 mL/min — AB (ref >60)
Glucose, Bld: 146 mg/dL — ABNORMAL HIGH (ref 65–99)
PHOSPHORUS: 3.6 mg/dL (ref 2.5–4.6)
Potassium: 3.9 mmol/L (ref 3.5–5.1)
Sodium: 131 mmol/L — ABNORMAL LOW (ref 135–145)

## 2017-02-19 LAB — C DIFFICILE QUICK SCREEN W PCR REFLEX
C DIFFICLE (CDIFF) ANTIGEN: NEGATIVE
C Diff interpretation: NOT DETECTED
C Diff toxin: NEGATIVE

## 2017-02-19 LAB — PROTIME-INR
INR: 1.94
Prothrombin Time: 22 seconds — ABNORMAL HIGH (ref 11.4–15.2)

## 2017-02-19 LAB — ABO/RH: ABO/RH(D): B POS

## 2017-02-19 LAB — PREPARE RBC (CROSSMATCH)

## 2017-02-20 LAB — BPAM RBC
Blood Product Expiration Date: 201901022359
Blood Product Expiration Date: 201901022359
ISSUE DATE / TIME: 201812111450
ISSUE DATE / TIME: 201812111450
Unit Type and Rh: 7300
Unit Type and Rh: 7300

## 2017-02-20 LAB — TYPE AND SCREEN
ABO/RH(D): B POS
ANTIBODY SCREEN: NEGATIVE
UNIT DIVISION: 0
Unit division: 0

## 2017-02-20 MED FILL — Medication: Qty: 1 | Status: AC

## 2017-03-12 NOTE — Progress Notes (Signed)
Called to code [name].  Patient expired 7:51pm Bobby Taylor, Chaplain   03/04/2017 2000  Clinical Encounter Type  Visited With Other (Comment) (Code Blue)  Visit Type Code  Consult/Referral To Chaplain

## 2017-03-12 NOTE — Progress Notes (Signed)
  Patient Name: Bobby Taylor   MRN: [id number]   Date of Birth/ Sex: January 10, 1927 , male      Admission Date: 01/28/2017  Attending Provider: No att. providers found  Primary Diagnosis: MRSA  ESRD     Indication: Pt was admitted in [place] recovering from recent hospitalization, received HD today. This PM,  he was noted to be in ventricular fibrillation. Code blue was subsequently called. At the time of arrival on scene, ACLS protocol was underway.   Technical Description:  - CPR performance duration:  21 minutes  - Was defibrillation or cardioversion used? Yes   - Was external pacer placed? No  - Was patient intubated pre/post CPR? Yes, anesthesia intubated during ACLS   Medications Administered: Y = Yes; Blank = No Amiodarone  Y  Atropine    Calcium  Y  Epinephrine  Y  Lidocaine    Magnesium  Y  Norepinephrine    Phenylephrine    Sodium bicarbonate  Y  Vasopressin    Other Y, D50   Post CPR evaluation:  - Final Status - Was patient successfully resuscitated ? No   Miscellaneous Information:  - Time of death:  41   - Primary team notified?  Primary MD was notified and discussed the case during the code.   - Family Notified? Yes, family contacted to confirm full code status. Following unsuccessful resuscitation, primary MD to notify family      Tawny Asal, MD   Feb 25, 2017, 8:30 PM

## 2017-03-12 DEATH — deceased

## 2018-08-07 IMAGING — DX DG ABD PORTABLE 1V
1 series · 1 of 1 positions shown · non-contrast
Comparison: None.

CLINICAL DATA: 89 y/o  M; feeding tube placement.

EXAM:
PORTABLE ABDOMEN - 1 VIEW

[abdomen kub]
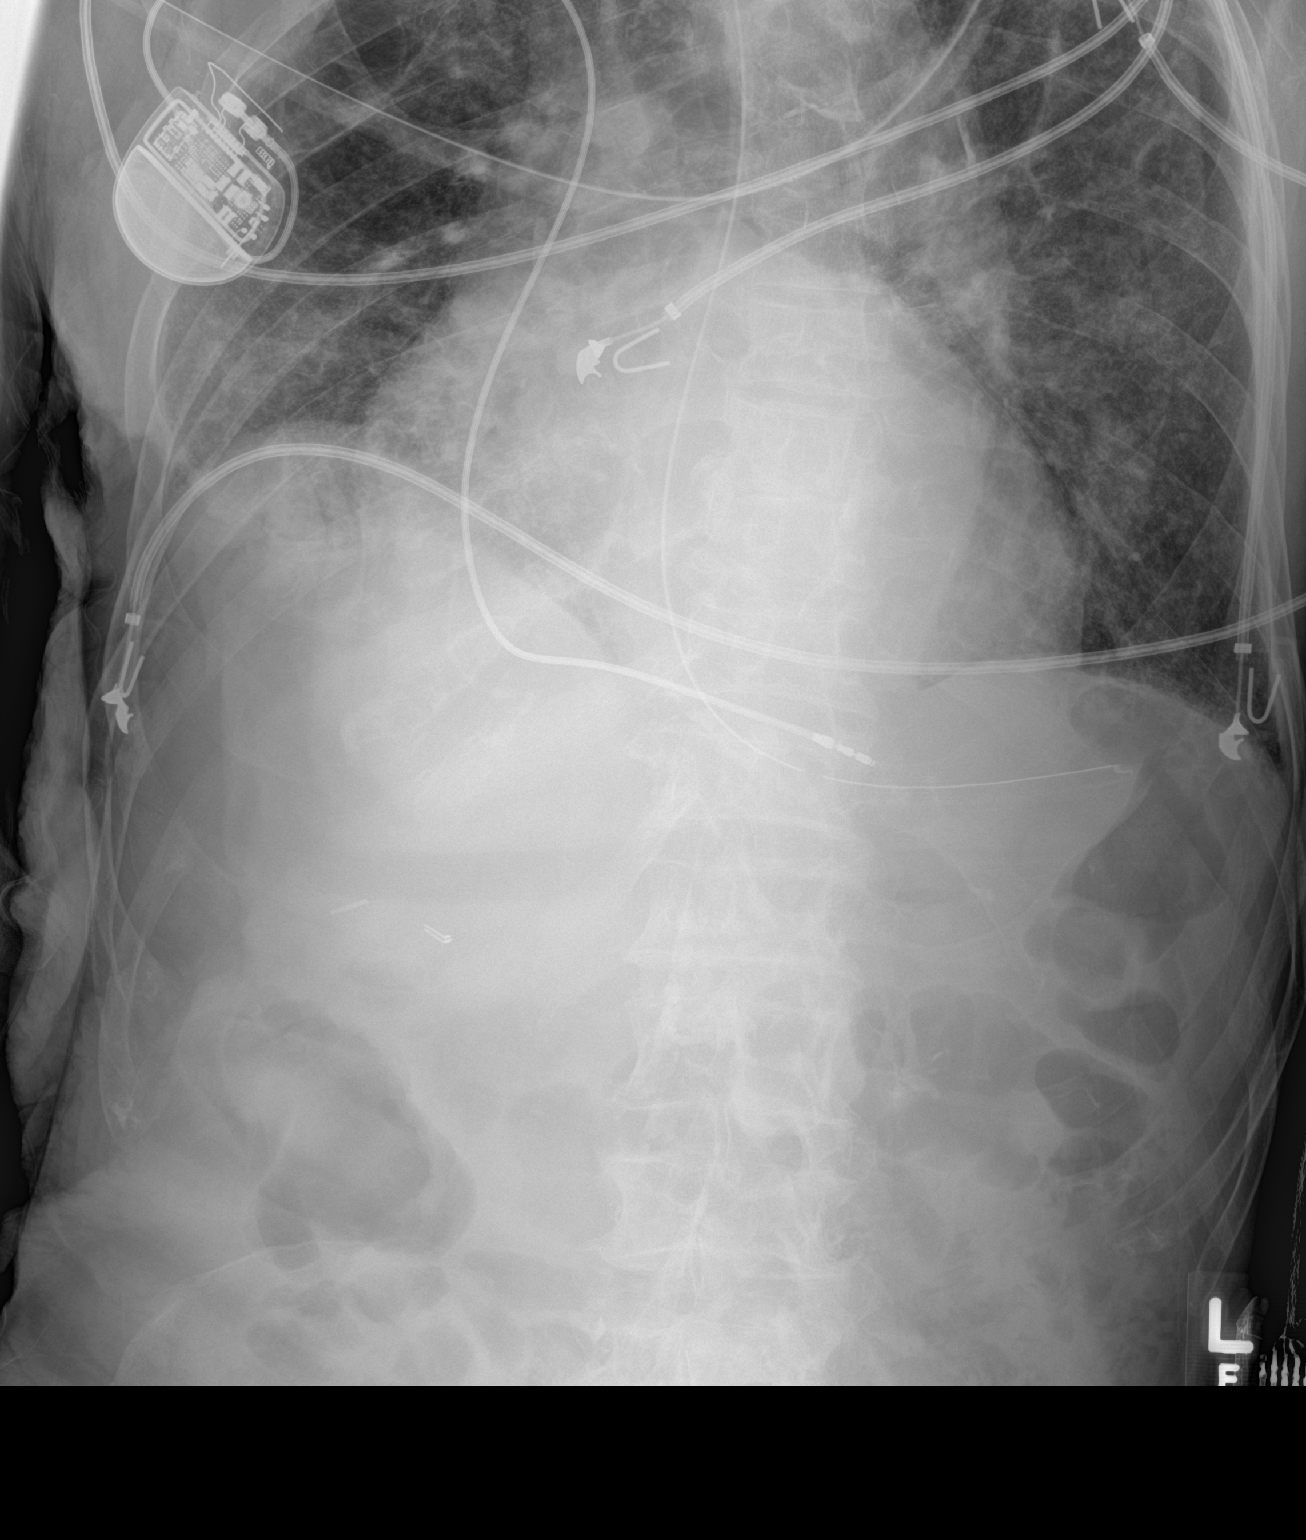

[1 of 1 positions shown; findings below may reference images not displayed]

FINDINGS: Enteric tube tip projects over proximal stomach. Right upper
quadrant surgical clips, presumably cholecystectomy. Single lead
pacemaker noted. Normal bowel gas pattern. Multilevel degenerative
changes of the spine.
IMPRESSION: Enteric tube tip projects over proximal stomach.

By: Brutus Jean Baptiste M.D.

## 2018-08-08 IMAGING — DX DG ABDOMEN 1V
1 series · 1 of 1 positions shown · non-contrast
Comparison: Radiograph February 05, 2017.

CLINICAL DATA: Nasogastric tube placement.

EXAM:
ABDOMEN - 1 VIEW

[abdomen]
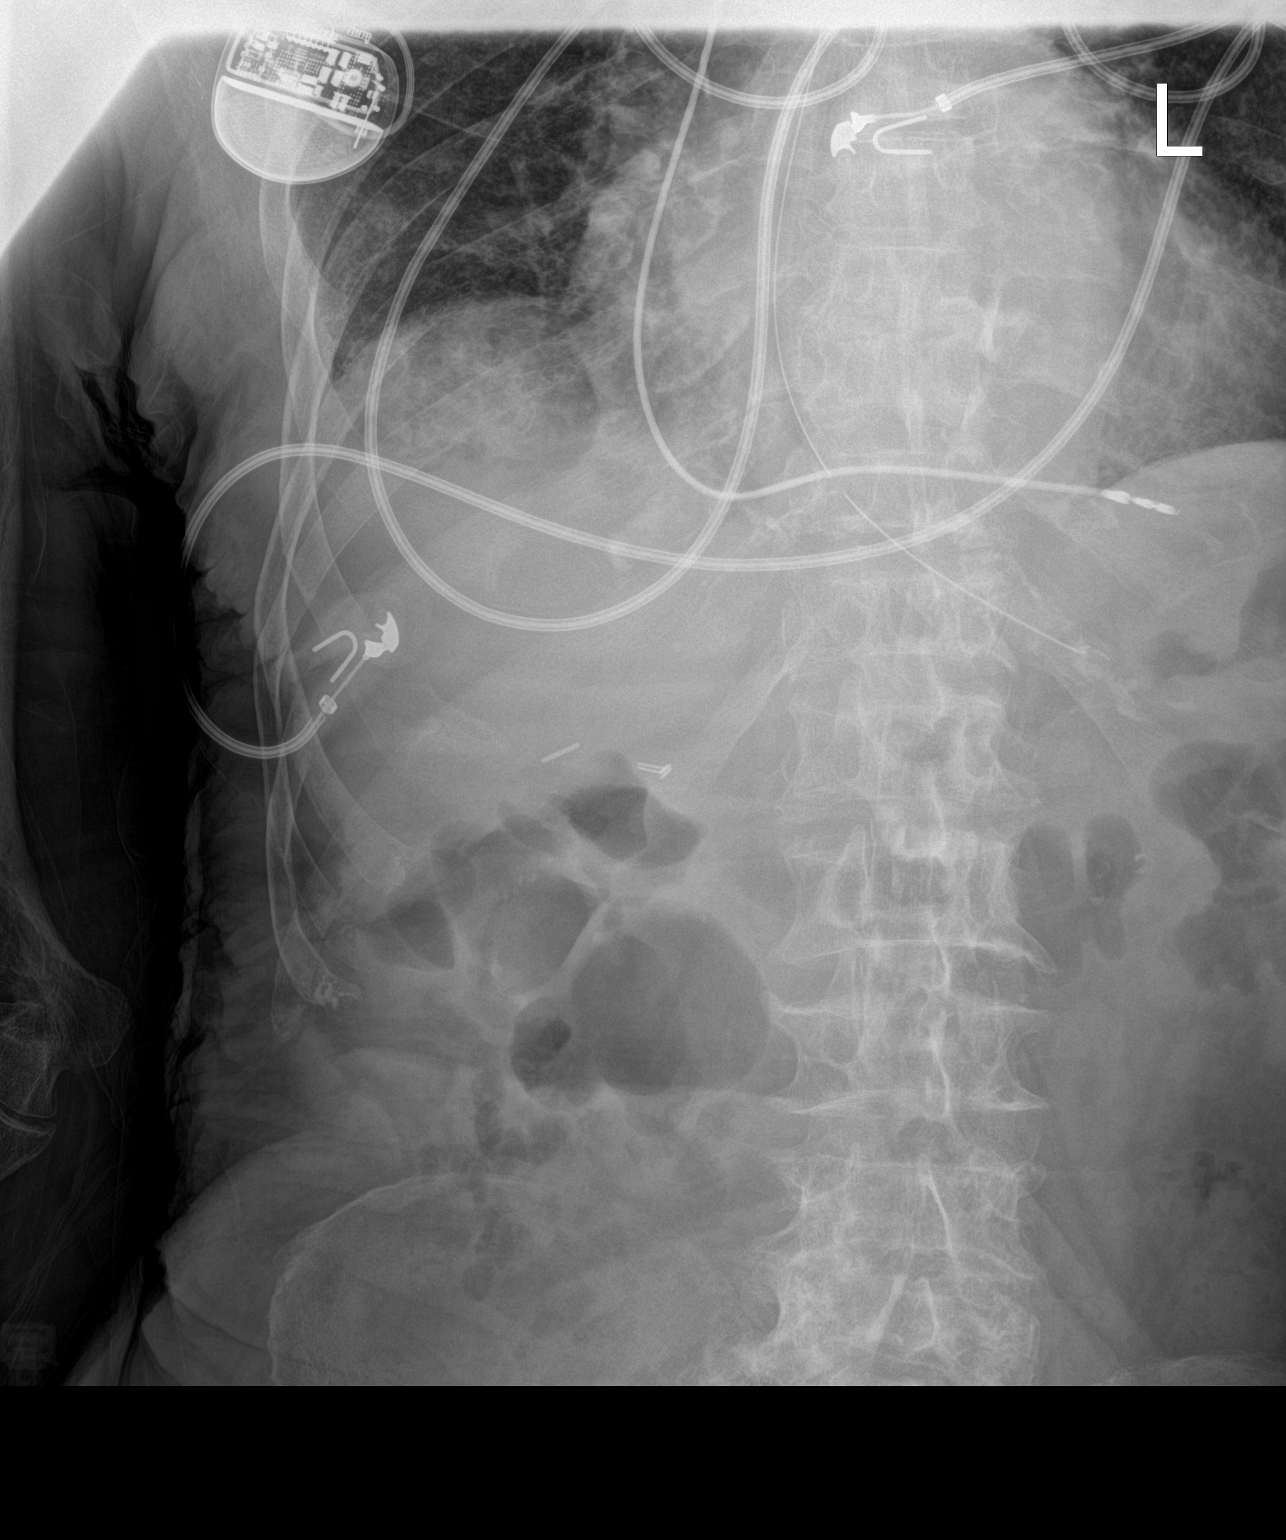

[1 of 1 positions shown; findings below may reference images not displayed]

FINDINGS: Nasogastric tube is been partially withdrawn since prior exam, with
distal tip in expected position of gastroesophageal junction. Status
post cholecystectomy. No abnormal bowel gas pattern is noted.
IMPRESSION: Distal tip of nasogastric tube is been partially withdrawn, now
located in expected position of gastroesophageal junction.
